# Patient Record
Sex: Male | Born: 2000
Health system: Southern US, Community
[De-identification: ages and names within clinical notes are randomized; demographics above are authoritative.]

---
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
## 2023-01-25 NOTE — Progress Notes (Unsigned)
  Cardiology Office Note:  .   Date:  01/25/2023  ID:  Rodney Shah, DOB 30-Jun-2000, MRN [id number] PCP: Marcene Corning, MD  [facility] Providers Cardiologist:  None { Click to update primary MD,subspecialty MD or APP then REFRESH:1}   History of Present Illness: .   Rodney Shah is a 22 y.o. male with no congenital heart disease hx , referral fo CP  ROS: ***  Studies Reviewed: .        *** Risk Assessment/Calculations:   {Does this patient have ATRIAL FIBRILLATION?:[phone]} No BP recorded.  {Refresh Note OR Click here to enter BP  :1}***       Physical Exam:   VS:  There were no vitals taken for this visit.   Wt Readings from Last 3 Encounters:  08/03/18 120 lb 5.9 oz (54.6 kg) (28%, Z= -0.59)*   * Growth percentiles are based on CDC (Boys, 2-20 Years) data.    GEN: Well nourished, well developed in no acute distress NECK: No JVD; No carotid bruits CARDIAC: ***RRR, no murmurs, rubs, gallops RESPIRATORY:  Clear to auscultation without rales, wheezing or rhonchi  ABDOMEN: Soft, non-tender, non-distended EXTREMITIES:  No edema; No deformity   ASSESSMENT AND PLAN: .   Non cardiac CP -low risk for CVD - no signs of HOCM       Dispo: PRN  Signed, Maisie Fus, MD

## 2023-01-27 ENCOUNTER — Ambulatory Visit: Payer: 59 | Attending: Internal Medicine | Admitting: Internal Medicine

## 2023-01-27 ENCOUNTER — Encounter: Payer: Self-pay | Admitting: Internal Medicine

## 2023-01-27 VITALS — BP 116/68 | HR 81 | Ht 68.0 in | Wt 163.4 lb

## 2023-01-27 DIAGNOSIS — R0602 Shortness of breath: Secondary | ICD-10-CM | POA: Diagnosis not present

## 2023-01-27 DIAGNOSIS — R9431 Abnormal electrocardiogram [ECG] [EKG]: Secondary | ICD-10-CM | POA: Diagnosis not present

## 2023-01-27 NOTE — Patient Instructions (Signed)
Medication Instructions:  No changes    *If you need a refill on your cardiac medications before your next appointment, please call your pharmacy*   Lab Work: None    If you have labs (blood work) drawn today and your tests are completely normal, you will receive your results only by: MyChart Message (if you have MyChart) OR A paper copy in the mail If you have any lab test that is abnormal or we need to change your treatment, we will call you to review the results.   Testing/Procedures: Echo will be scheduled at [address].  Your physician has requested that you have an echocardiogram. Echocardiography is a painless test that uses sound waves to create images of your heart. It provides your doctor with information about the size and shape of your heart and how well your heart's chambers and valves are working. This procedure takes approximately one hour. There are no restrictions for this procedure. Please do NOT wear cologne, perfume, aftershave, or lotions (deodorant is allowed). Please arrive 15 minutes prior to your appointment time.    Follow-Up: At [facility], you and your health needs are our priority.  As part of our continuing mission to provide you with exceptional heart care, we have created designated Provider Care Teams.  These Care Teams include your primary Cardiologist (physician) and Advanced Practice Providers (APPs -  Physician Assistants and Nurse Practitioners) who all work together to provide you with the care you need, when you need it.  We recommend signing up for the patient portal called "MyChart".  Sign up information is provided on this After Visit Summary.  MyChart is used to connect with patients for Virtual Visits (Telemedicine).  Patients are able to view lab/test results, encounter notes, upcoming appointments, etc.  Non-urgent messages can be sent to your provider as well.   To learn more about what you can do with MyChart, go to  [email or web address].    Your next appointment:   Follow up as needed pending results of cardiac testing  Provider:   Maisie Fus, MD    Other Instructions

## 2023-03-08 ENCOUNTER — Ambulatory Visit (HOSPITAL_COMMUNITY): Payer: 59

## 2023-04-13 ENCOUNTER — Ambulatory Visit (HOSPITAL_COMMUNITY): Payer: 59 | Attending: Internal Medicine

## 2023-04-13 DIAGNOSIS — R9431 Abnormal electrocardiogram [ECG] [EKG]: Secondary | ICD-10-CM | POA: Diagnosis present

## 2023-04-13 DIAGNOSIS — R0602 Shortness of breath: Secondary | ICD-10-CM | POA: Insufficient documentation

## 2023-04-13 LAB — ECHOCARDIOGRAM COMPLETE BUBBLE STUDY
Area-P 1/2: 4.63 cm2
S' Lateral: 2.7 cm

## 2023-10-15 ENCOUNTER — Encounter: Payer: Self-pay | Admitting: Podiatry

## 2023-10-15 ENCOUNTER — Ambulatory Visit

## 2023-10-15 ENCOUNTER — Ambulatory Visit (INDEPENDENT_AMBULATORY_CARE_PROVIDER_SITE_OTHER): Admitting: Podiatry

## 2023-10-15 DIAGNOSIS — S9031XA Contusion of right foot, initial encounter: Secondary | ICD-10-CM

## 2023-10-15 DIAGNOSIS — S90811A Abrasion, right foot, initial encounter: Secondary | ICD-10-CM | POA: Diagnosis not present

## 2023-10-15 DIAGNOSIS — S90121A Contusion of right lesser toe(s) without damage to nail, initial encounter: Secondary | ICD-10-CM | POA: Diagnosis not present

## 2023-10-15 MED ORDER — MUPIROCIN 2 % EX OINT: 1.0000 | TOPICAL_OINTMENT | Freq: Two times a day (BID) | CUTANEOUS | 0 refills | Status: AC

## 2023-10-15 NOTE — Progress Notes (Signed)
 Chief Complaint  Patient presents with   Foot Injury    Dumbell plate fell on right dorsal foot Tuesday. 4 pain. Prescribed meloxicam and ibuprofen . Non diabetic.     HPI: 23 y.o. male presents today with right dorsal foot injury.  States that he was working out and dropped a large 45 pound plate on the foot 3 days prior.  He went to urgent care for this.  He is been utilizing a cam boot with crutches.  Has been weightbearing some with it.  Does have an abrasion with this.  Does report pain and difficulty weightbearing.  History reviewed. No pertinent past medical history.  Past Surgical History:  Procedure Laterality Date   LAPAROSCOPIC APPENDECTOMY N/A 08/02/2018   Procedure: APPENDECTOMY LAPAROSCOPIC;  Surgeon: Chuckie Casimiro KIDD, MD;  Location: [facility] OR;  Service: Pediatrics;  Laterality: N/A;    Allergies  Allergen Reactions   Lactose Intolerance (Gi) Nausea And Vomiting    ROS    Physical Exam: There were no vitals filed for this visit.  General: The patient is alert and oriented x3 in no acute distress.  Dermatology: Skin is warm, dry and supple bilateral lower extremities. Interspaces are clear of maceration and debris.  Superficial abrasion present dorsal midfoot level of the 1st and 2nd metatarsals proximally.  Vascular: Palpable pedal pulses bilaterally. Capillary refill within normal limits.  No erythema or calor.  Some ecchymosis about the right dorsal midfoot  Neurological: Light touch sensation grossly intact bilateral feet.   Musculoskeletal Exam: Tenderness on palpation of right dorsal midfoot.  Localized edema present.  Right foot dorsiflexion strength and range of motion is limited secondary to pain and guarding but active dorsiflexion is available.  Radiographic Exam: Right foot 3 views weightbearing 10/15/2023 Normal osseous mineralization. Joint spaces preserved.  No fractures or osseous irregularities noted.  Assessment/Plan of Care: 1. Contusion of  right foot including toes, initial encounter   2. Abrasion of right foot, initial encounter      Meds ordered this encounter  Medications   mupirocin ointment (BACTROBAN) 2 %    Sig: Apply 1 Application topically 2 (two) times daily for 14 days.    Dispense:  28 g    Refill:  0   DG FOOT COMPLETE RIGHT  Discussed clinical findings with patient today.  Radiographs reviewed with patient.  Negative for acute fracture.  Do suspect bone contusion.  Continue use of the cam boot.  Okay to come out of the cam boot for nonweightbearing range of motion exercises and home rehab.  Does have prescription for meloxicam from urgent care.  He should continue this.  Advised patient to try and limit weightbearing with use of the crutches over the next couple days.  Following that he can begin to partial weight-bear as tolerated.  Continue use of the cam boot at least through follow-up.  Continue with RICE therapy as well.  Today bacitracin ointment was applied to the abrasions and a 2 layer compressive bandage was applied for edema control.  Recommend that he proceed with this at home.  Mupirocin prescribed to the patient.  Apply 1-2 times daily to the abrasion.  Can discontinue once the abrasion is firmly scabbed over.   Donnella Morford L. Lamount MAUL, AACFAS [facility]     [address]  [place], [place] [address]                Office [phone]  Fax [phone]

## 2023-10-15 NOTE — Patient Instructions (Signed)
 [email or web address]  Search this URL for extensive information on ankle extensor tendinitis rehabilitation program.  For now focus on nonweightbearing exercises with limited resistance focusing on range of motion.

## 2023-10-21 ENCOUNTER — Ambulatory Visit: Admitting: Podiatry

## 2023-10-28 ENCOUNTER — Encounter: Payer: Self-pay | Admitting: Podiatry

## 2023-10-28 ENCOUNTER — Ambulatory Visit (INDEPENDENT_AMBULATORY_CARE_PROVIDER_SITE_OTHER): Admitting: Podiatry

## 2023-10-28 DIAGNOSIS — S90121D Contusion of right lesser toe(s) without damage to nail, subsequent encounter: Secondary | ICD-10-CM

## 2023-10-28 DIAGNOSIS — S90811D Abrasion, right foot, subsequent encounter: Secondary | ICD-10-CM | POA: Diagnosis not present

## 2023-10-28 DIAGNOSIS — S9031XD Contusion of right foot, subsequent encounter: Secondary | ICD-10-CM | POA: Diagnosis not present

## 2023-10-28 NOTE — Progress Notes (Unsigned)
       Chief Complaint  Patient presents with   Foot Pain    R foot contusion follow up  feeling better.  Not diabetic no anti coag    HPI: 23 y.o. male presents today following up for right dorsal forefoot bone contusion with abrasion following dropping a 45 pound weight lifting plate on the foot.  Pain has improved at this point.  Reports good benefit from compression.  Has been keeping up with local wound care to the abrasion site and has been compliant with cam boot use.  History reviewed. No pertinent past medical history.  Past Surgical History:  Procedure Laterality Date   LAPAROSCOPIC APPENDECTOMY N/A 08/02/2018   Procedure: APPENDECTOMY LAPAROSCOPIC;  Surgeon: Chuckie Casimiro KIDD, MD;  Location: [facility] OR;  Service: Pediatrics;  Laterality: N/A;    Allergies  Allergen Reactions   Lactose Intolerance (Gi) Nausea And Vomiting    ROS    Physical Exam: There were no vitals filed for this visit.  General: The patient is alert and oriented x3 in no acute distress.  Dermatology: Skin is warm, dry and supple bilateral lower extremities. Interspaces are clear of maceration and debris.  Abrasion dorsal first metatarsal right foot appears to be healing well without signs of infection.  Vascular: Palpable pedal pulses bilaterally. Capillary refill within normal limits.  No erythema or calor.  Some ecchymosis about the right dorsal midfoot  Neurological: Light touch sensation grossly intact bilateral feet.   Musculoskeletal Exam: Decreased tenderness on palpation of right dorsal midfoot.  Right foot edema appears improved.  Improved range of motion and muscle strength right foot.  Radiographic Exam: Right foot 3 views weightbearing 10/15/2023 Normal osseous mineralization. Joint spaces preserved.  No fractures or osseous irregularities noted.  Assessment/Plan of Care: 1. Contusion of right foot including toes, subsequent encounter   2. Abrasion of right foot, subsequent encounter       No orders of the defined types were placed in this encounter.  None  Discussed clinical findings with patient today.  He is progressing well.  He can begin leaving the abrasion open to air at home.  Recommend that he keep it covered to prevent rubbing in cam boot or regular shoe gear.  He can transition to regular shoes as tolerated.  Did discuss 30-minute rule in that he may experience some soreness as he gets out of the boot and into the shoe.  Follow-up as needed if symptoms recur or worsen.   Christion Leonhard L. Lamount MAUL, AACFAS [facility]     [address] [place], [place] [address]                Office [phone]  Fax [phone]
